# Patient Record
Sex: Male | Born: 1995 | Race: White | Hispanic: No | Marital: Single | Smoking: Never smoker
Health system: Southern US, Community
[De-identification: ages and names within clinical notes are randomized; demographics above are authoritative.]

## PROBLEM LIST (undated history)

## (undated) HISTORY — PX: TONSILLECTOMY: SUR1361

---

## 2019-12-15 ENCOUNTER — Emergency Department (HOSPITAL_COMMUNITY)
Admission: EM | Admit: 2019-12-15 | Discharge: 2019-12-15 | Disposition: A | Discharge status: Home / Self Care | Payer: Managed Care, Other (non HMO) | Attending: Emergency Medicine | Admitting: Emergency Medicine

## 2019-12-15 ENCOUNTER — Encounter (HOSPITAL_COMMUNITY): Payer: Self-pay

## 2019-12-15 ENCOUNTER — Emergency Department (HOSPITAL_COMMUNITY): Payer: Managed Care, Other (non HMO)

## 2019-12-15 ENCOUNTER — Other Ambulatory Visit: Payer: Self-pay

## 2019-12-15 DIAGNOSIS — R109 Unspecified abdominal pain: Secondary | ICD-10-CM | POA: Diagnosis present

## 2019-12-15 LAB — URINALYSIS, ROUTINE W REFLEX MICROSCOPIC
Bilirubin Urine: NEGATIVE
Glucose, UA: NEGATIVE mg/dL
Hgb urine dipstick: NEGATIVE
Ketones, ur: NEGATIVE mg/dL
Leukocytes,Ua: NEGATIVE
Nitrite: NEGATIVE
Protein, ur: NEGATIVE mg/dL
Specific Gravity, Urine: 1.02 (ref 1.005–1.030)
pH: 6 (ref 5.0–8.0)

## 2019-12-15 LAB — CBC
HCT: 44.1 % (ref 39.0–52.0)
Hemoglobin: 15.6 g/dL (ref 13.0–17.0)
MCH: 29.7 pg (ref 26.0–34.0)
MCHC: 35.4 g/dL (ref 30.0–36.0)
MCV: 83.8 fL (ref 80.0–100.0)
Platelets: 229 10*3/uL (ref 150–400)
RBC: 5.26 MIL/uL (ref 4.22–5.81)
RDW: 12.6 % (ref 11.5–15.5)
WBC: 6.2 10*3/uL (ref 4.0–10.5)
nRBC: 0 % (ref 0.0–0.2)

## 2019-12-15 LAB — COMPREHENSIVE METABOLIC PANEL
ALT: 21 U/L (ref 0–44)
AST: 23 U/L (ref 15–41)
Albumin: 4.7 g/dL (ref 3.5–5.0)
Alkaline Phosphatase: 55 U/L (ref 38–126)
Anion gap: 8 (ref 5–15)
BUN: 17 mg/dL (ref 6–20)
CO2: 26 mmol/L (ref 22–32)
Calcium: 9 mg/dL (ref 8.9–10.3)
Chloride: 105 mmol/L (ref 98–111)
Creatinine, Ser: 1.07 mg/dL (ref 0.61–1.24)
GFR, Estimated: >60 mL/min (ref >60)
Glucose, Bld: 110 mg/dL — ABNORMAL HIGH (ref 70–99)
Potassium: 4.5 mmol/L (ref 3.5–5.1)
Sodium: 139 mmol/L (ref 135–145)
Total Bilirubin: 0.7 mg/dL (ref 0.3–1.2)
Total Protein: 7.4 g/dL (ref 6.5–8.1)

## 2019-12-15 LAB — LIPASE, BLOOD: Lipase: 42 U/L (ref 11–51)

## 2019-12-15 NOTE — ED Triage Notes (Signed)
Pt presents with c/o abdominal pain that started approx one hour ago. Pt does report some nausea with the pain but denies any vomiting or diarrhea.

## 2019-12-15 NOTE — ED Provider Notes (Signed)
[facility]-EMERGENCY DEPT Provider Note   CSN: [id number] Arrival date & time: 12/15/19  1048     History Chief Complaint  Patient presents with  . Abdominal Pain    Keith Duncan is a 24 y.o. male.  HPI Patient with abdominal pain.  Began just prior to arrival.  Severe.  Cramping.  No fevers.  No nausea or vomiting.  No diarrhea.  Has returned at times but also improving overall.  Has not had pains like this before.  No dysuria.    History reviewed. No pertinent past medical history.  There are no problems to display for this patient.   Past Surgical History:  Procedure Laterality Date  . TONSILLECTOMY         History reviewed. No pertinent family history.  Social History   Tobacco Use  . Smoking status: Never Smoker  . Smokeless tobacco: Never Used  Substance Use Topics  . Alcohol use: Yes    Comment: occasionally   . Drug use: Never    Home Medications Prior to Admission medications   Not on File    Allergies    Patient has no known allergies.  Review of Systems   Review of Systems  Constitutional: Negative for appetite change.  HENT: Negative for congestion.   Respiratory: Negative for shortness of breath.   Cardiovascular: Negative for chest pain.  Gastrointestinal: Positive for abdominal pain.  Genitourinary: Negative for flank pain.  Musculoskeletal: Negative for back pain.  Skin: Negative for rash.  Neurological: Negative for weakness.  Psychiatric/Behavioral: Negative for confusion.    Physical Exam Updated Vital Signs BP 127/64   Pulse (!) 51   Temp 98.4 F (36.9 C) (Oral)   Resp 18   SpO2 98%   Physical Exam Vitals and nursing note reviewed.  HENT:     Head: Normocephalic.  Cardiovascular:     Rate and Rhythm: Normal rate.  Pulmonary:     Effort: Pulmonary effort is normal.  Abdominal:     Tenderness: There is no abdominal tenderness.     Hernia: No hernia is present.  Skin:    General: Skin is warm.       Capillary Refill: Capillary refill takes less than 2 seconds.  Neurological:     Mental Status: He is alert and oriented to person, place, and time.     ED Results / Procedures / Treatments   Labs (all labs ordered are listed, but only abnormal results are displayed) Labs Reviewed  COMPREHENSIVE METABOLIC PANEL - Abnormal; Notable for the following components:      Result Value   Glucose, Bld 110 (*)    All other components within normal limits  LIPASE, BLOOD  CBC  URINALYSIS, ROUTINE W REFLEX MICROSCOPIC    EKG None  Radiology DG Abd 2 Views  Result Date: 12/15/2019 CLINICAL DATA:  Abdominal pain EXAM: ABDOMEN - 2 VIEW COMPARISON:  None. FINDINGS: The bowel gas pattern is normal. Mild to moderate stool burden. There is no evidence of free air. No radio-opaque calculi or other significant radiographic abnormality is seen. IMPRESSION: Unremarkable bowel gas pattern. Electronically Signed   By: Guadlupe Spanish M.D.   On: 12/15/2019 13:50    Procedures Procedures (including critical care time)  Medications Ordered in ED Medications - No data to display  ED Course  I have reviewed the triage vital signs and the nursing notes.  Pertinent labs & imaging results that were available during my care of the patient were reviewed  by me and considered in my medical decision making (see chart for details).    MDM Rules/Calculators/A&P                          Patient abdominal pain.  Has been crampy.  Much better now.  Benign exam.  No tenderness.  Lab work reassuring.  X-ray done shows unremarkable bowel gas pattern.  Urine does not show infection or blood.  Pending.  Patient is stable for discharge home with outpatient follow-up as needed. I reviewed imaging and lab results. Final Clinical Impression(s) / ED Diagnoses Final diagnoses:  Abdominal pain    Rx / DC Orders ED Discharge Orders    None       Benjiman Core, MD 12/15/19 1515

## 2022-04-22 IMAGING — CR DG ABDOMEN 2V
2 series · 2 of 2 positions shown · non-contrast
Comparison: None.

CLINICAL DATA: Abdominal pain

EXAM:
ABDOMEN - 2 VIEW

[w abdomen upright]
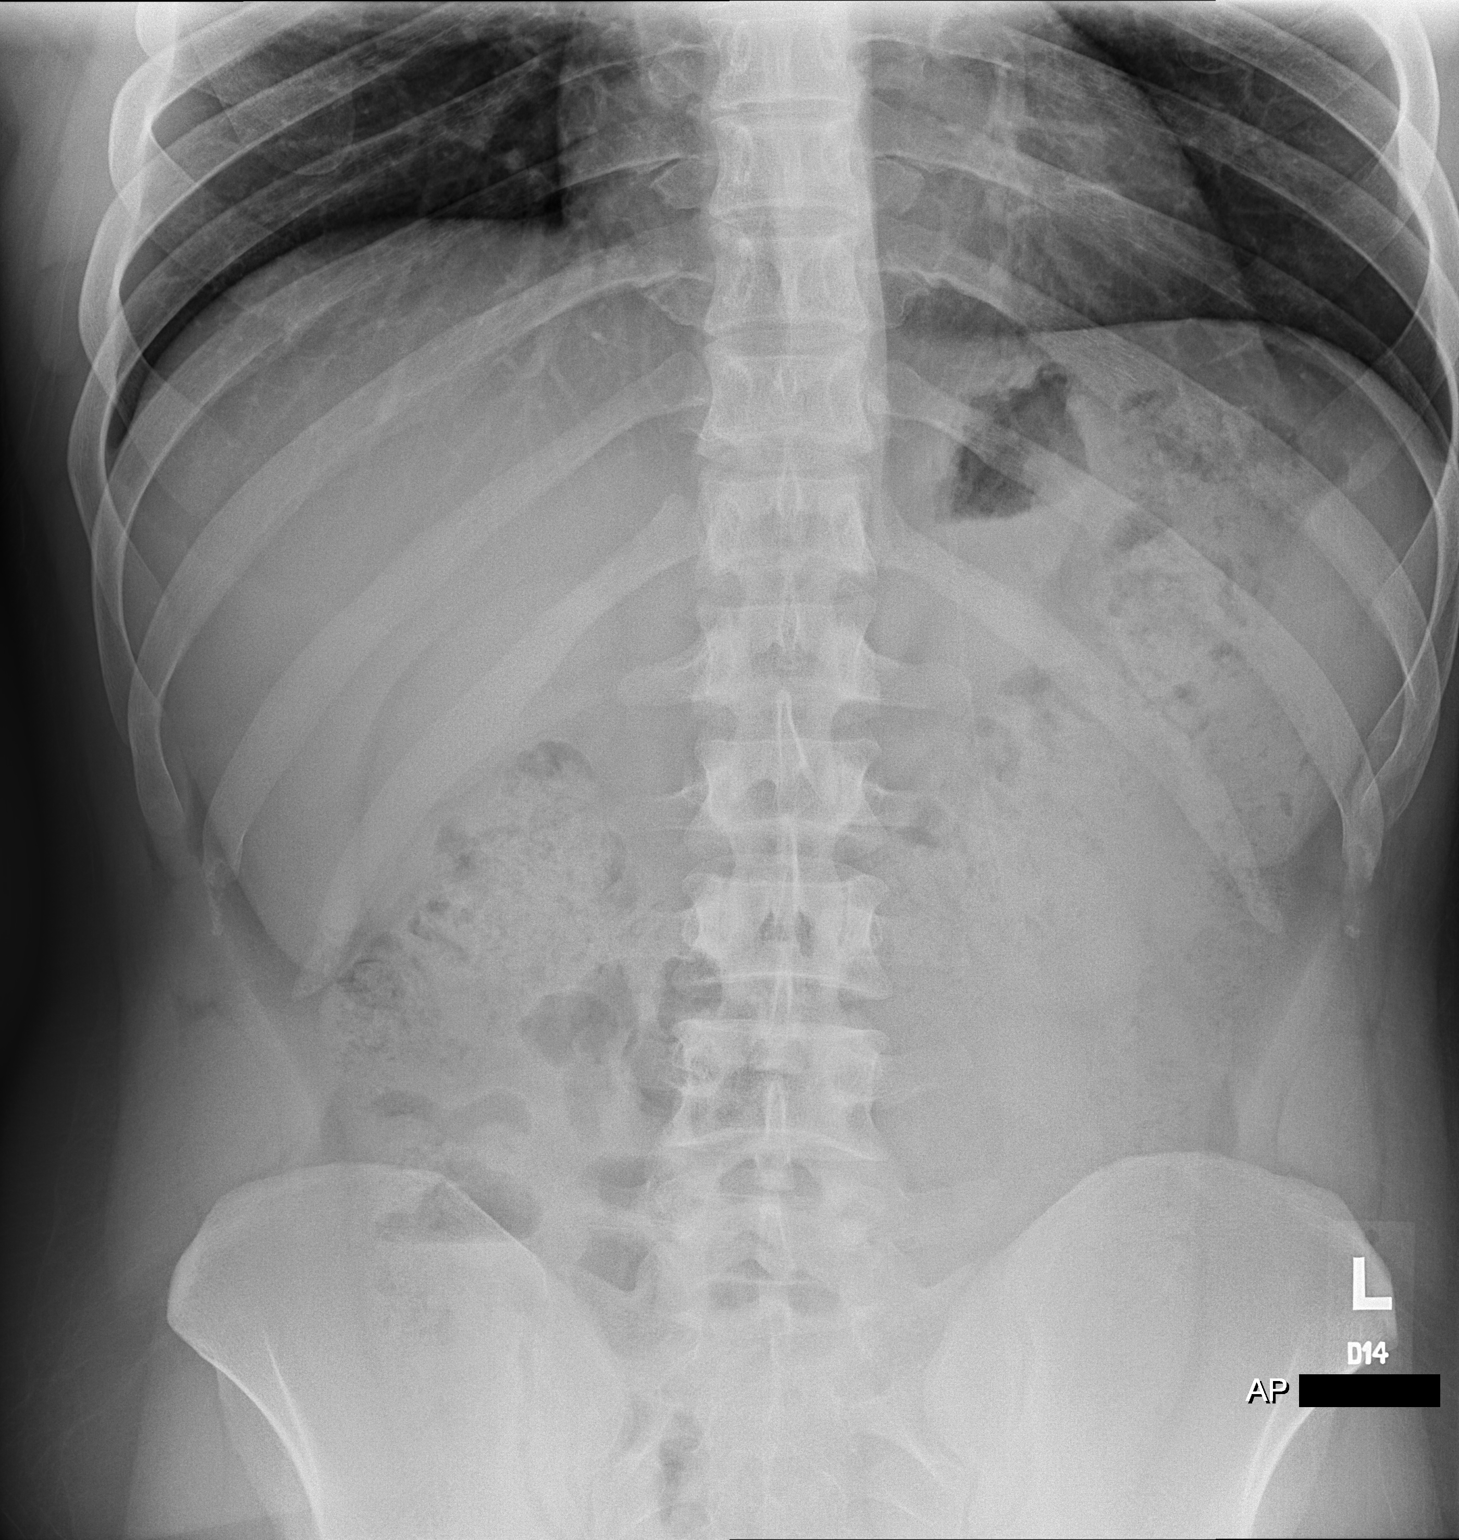

[t abdomen supine]
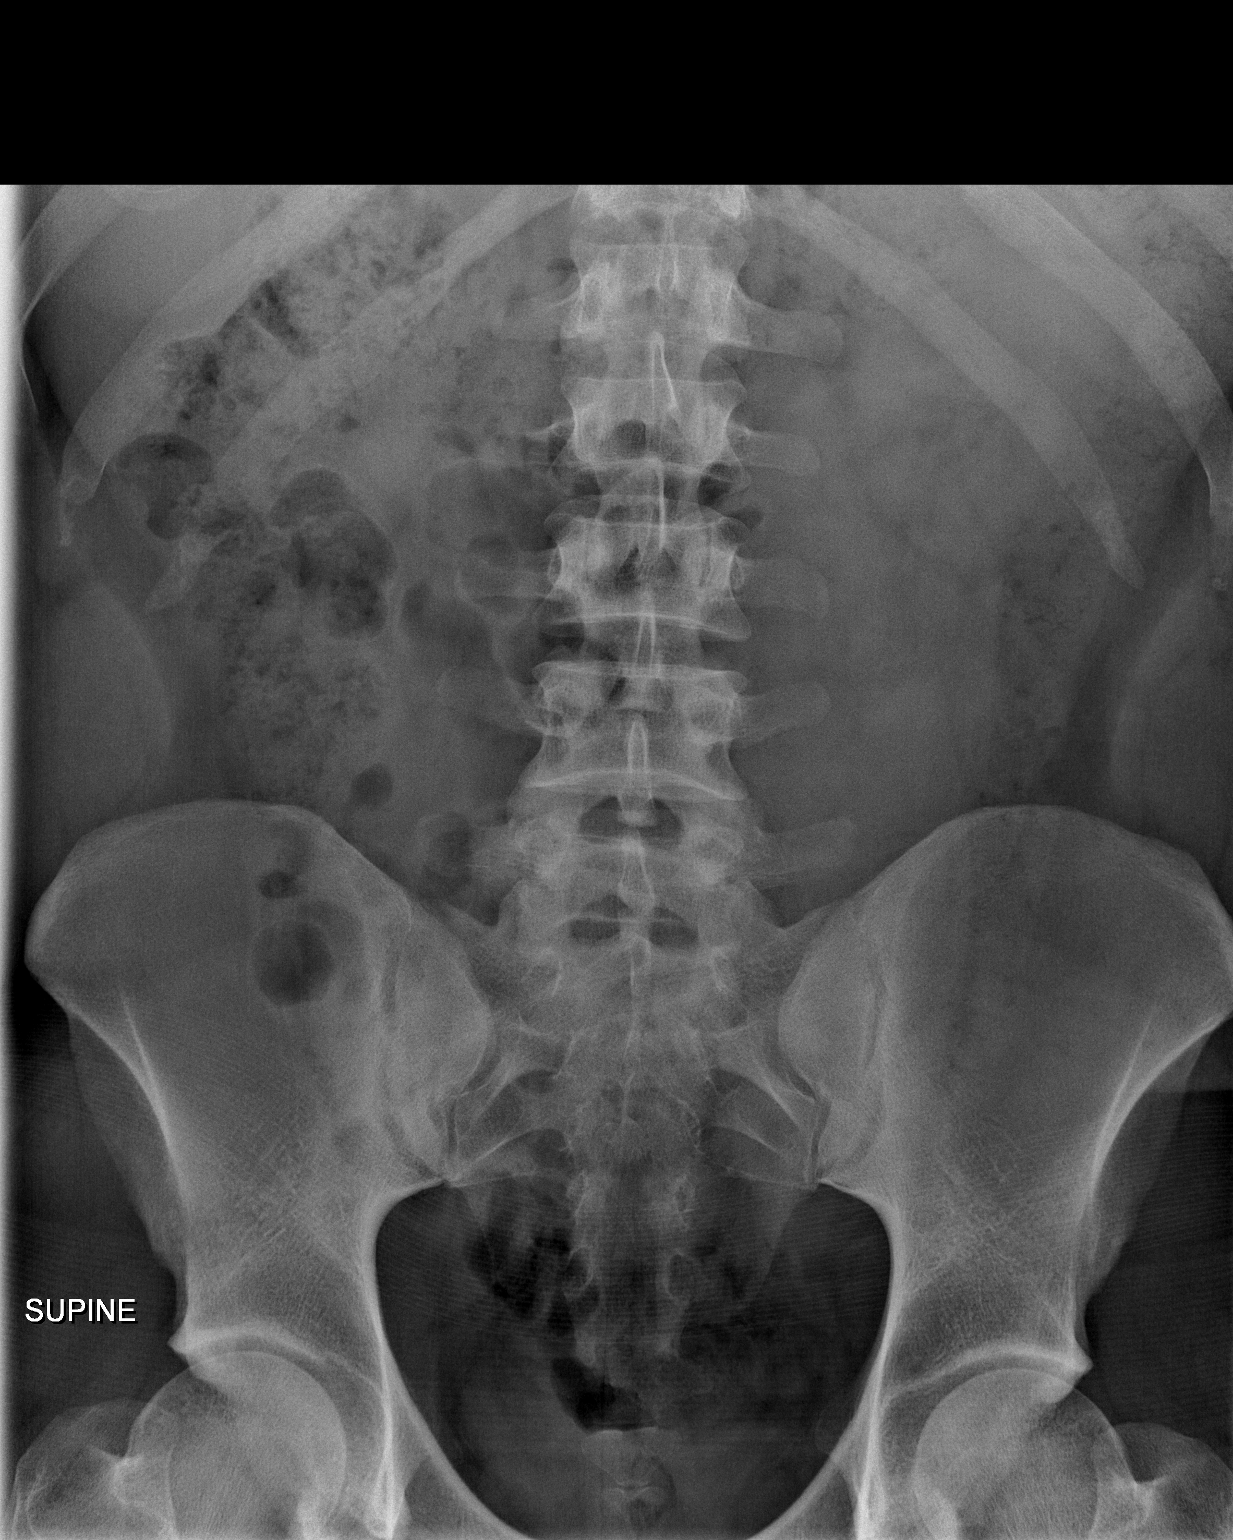

[2 of 2 positions shown; findings below may reference images not displayed]

FINDINGS: The bowel gas pattern is normal. Mild to moderate stool burden.
There is no evidence of free air. No radio-opaque calculi or other
significant radiographic abnormality is seen.
IMPRESSION: Unremarkable bowel gas pattern.
